# Patient Record
Sex: Female | Born: 1970 | Race: White | Hispanic: No | Marital: Single | Smoking: Former smoker
Health system: Southern US, Community
[De-identification: ages and names within clinical notes are randomized; demographics above are authoritative.]

## PROBLEM LIST (undated history)

## (undated) DIAGNOSIS — N200 Calculus of kidney: Secondary | ICD-10-CM

## (undated) DIAGNOSIS — N159 Renal tubulo-interstitial disease, unspecified: Secondary | ICD-10-CM

## (undated) DIAGNOSIS — C539 Malignant neoplasm of cervix uteri, unspecified: Secondary | ICD-10-CM

## (undated) HISTORY — PX: CYSTOSCOPY W/ URETERAL STENT PLACEMENT: SHX1429

## (undated) HISTORY — PX: OTHER SURGICAL HISTORY: SHX169

---

## 2002-06-21 ENCOUNTER — Encounter: Payer: Self-pay | Admitting: Obstetrics and Gynecology

## 2002-06-21 ENCOUNTER — Ambulatory Visit (HOSPITAL_COMMUNITY): Admission: RE | Admit: 2002-06-21 | Discharge: 2002-06-21 | Payer: Self-pay | Admitting: Obstetrics and Gynecology

## 2002-11-15 ENCOUNTER — Inpatient Hospital Stay (HOSPITAL_COMMUNITY): Admission: AD | Admit: 2002-11-15 | Discharge: 2002-11-17 | Payer: Self-pay | Admitting: Obstetrics and Gynecology

## 2002-12-19 ENCOUNTER — Encounter: Admission: RE | Admit: 2002-12-19 | Discharge: 2003-01-18 | Payer: Self-pay | Admitting: Obstetrics and Gynecology

## 2003-02-18 ENCOUNTER — Encounter: Admission: RE | Admit: 2003-02-18 | Discharge: 2003-03-20 | Payer: Self-pay | Admitting: Obstetrics and Gynecology

## 2004-04-18 ENCOUNTER — Other Ambulatory Visit: Admission: RE | Admit: 2004-04-18 | Discharge: 2004-04-18 | Payer: Self-pay | Admitting: Obstetrics and Gynecology

## 2005-03-18 ENCOUNTER — Other Ambulatory Visit: Admission: RE | Admit: 2005-03-18 | Discharge: 2005-03-18 | Payer: Self-pay | Admitting: Obstetrics and Gynecology

## 2010-02-23 ENCOUNTER — Emergency Department (HOSPITAL_COMMUNITY): Admission: EM | Admit: 2010-02-23 | Discharge: 2010-02-23 | Payer: Self-pay | Admitting: Emergency Medicine

## 2011-01-09 NOTE — Discharge Summary (Signed)
NAME:  KIFFANY, SCHELLING                    ACCOUNT NO.:  [id number]   MEDICAL RECORD NO.:  [id number]                   PATIENT TYPE:  INP   LOCATION:  9127                                 FACILITY:  [facility]   PHYSICIAN:  Malachi Pro. Ambrose Mantle, M.D.              DATE OF BIRTH:  Jul 28, 1971   DATE OF ADMISSION:  11/15/2002  DATE OF DISCHARGE:  11/17/2002                                 DISCHARGE SUMMARY   This is a 40 year old white, married female para 2, 0, 1, 2, gravida 4 with  an EDC of 11/18/02 by last period, compatible with a first trimester  ultrasound at 11 weeks who presented to labor and delivery with spontaneous  rupture of membranes at 4:30 a.m. on 11/15/02 and minimal contractions.  Prenatal care was complicated by history of low transverse cervical C-  section for breech presentation with her second pregnancy as she desired a  vaginal birth after cesarean.   PRENATAL LABS:  B positive, negative antibody. RPR nonreactive.  Rubella  equivocal.  Hepatitis B surface antigen negative.  GC and Chlamydia  negative.  Group B strep negative.  One hour Glucola 109.   PAST OBSTETRIC HISTORY:  In 1991 an early abortion, in 2000 spontaneous  delivery of an 8 pound, 6 ounce infant, in 2001 a C-section for breech, 8  pounds, 4 ounces.   PAST GYN HISTORY:  History of abnormal PAPs that resolved.   SURGICAL HISTORY:  C-section and elbow surgery.   MEDICAL HISTORY:  History of frequent urinary tract infections.   ALLERGIES:  Morphine, caused itching.   HOSPITAL COURSE:  On admission the patient was afebrile with normal vital  signs.  Contractions were irregular at every 10 minutes.  The cervix was 3  cm, 70%, grossly ruptured fluid was seen and it was a vertex  presentation.  The patient was placed on Pitocin, by 11:40 a.m.  the Pitocin was at 6  milliunits a minute.  Cervix 3-4 cm, 80%, vertex at a -2, the patient  requesting an epidural. The Pitocin was decreased by the nurse and at  1:30  p.m. the contractions were only every 5-6 minutes, cervix was unchanged so  the Pitocin was increased.  She began regular contraction pattern again,  progressed to full dilatation and delivered our way spontaneously over a  second degree midline laceration, by Dr. Ambrose Mantle, a living female infant, 8  pounds, 7 ounces with Apgar's of 9 at one and 9 at five minutes.  Placenta  was intact.  Uterus normal.  C-section scar was intact.  Rectal was  negative.  A 2 cm perineal cyst at the right of the midline ruptured on exam  and it was not removed because of close proximity to the rectum.  Midline  laceration was repaired with 3-0 Vicryl.  Blood loss about 400 cc.  Postpartum the patient did quite well and was discharged on the second  postpartum day.  LABORATORY DATA:  Hemoglobin was 12.3, hematocrit 36.3, white count 9700,  platelet count 170,000.  Follow up hemoglobin was normal.  RPR was  nonreactive.   FINAL DIAGNOSES:  1. Intrauterine pregnancy at 39 weeks delivered our way.  2. Vaginal birth after cesarean.   OPERATIONS:  Spontaneous delivery our way, repair of midline perineal  laceration.   FINAL CONDITION:  Instructions include our regular discharge instruction  booklet.  The patient is going to receive Rubella vaccine.  She also is  given a prescription for Percocet 5/325 #16 tablets, one q.4h p.r.n. for  pain.  She is asked to return to the office in six weeks for follow up  examination.                                                Malachi Pro. Ambrose Mantle, M.D.    TFH/MEDQ  D:  11/17/2002  T:  11/17/2002  Job:  [id number]

## 2011-10-06 ENCOUNTER — Other Ambulatory Visit: Payer: Self-pay | Admitting: Obstetrics and Gynecology

## 2011-10-06 DIAGNOSIS — Z1231 Encounter for screening mammogram for malignant neoplasm of breast: Secondary | ICD-10-CM

## 2011-10-28 ENCOUNTER — Ambulatory Visit
Admission: RE | Admit: 2011-10-28 | Discharge: 2011-10-28 | Disposition: A | Discharge status: Home / Self Care | Payer: BC Managed Care – PPO | Source: Ambulatory Visit | Attending: Obstetrics and Gynecology | Admitting: Obstetrics and Gynecology

## 2011-10-28 DIAGNOSIS — Z1231 Encounter for screening mammogram for malignant neoplasm of breast: Secondary | ICD-10-CM

## 2011-11-27 ENCOUNTER — Ambulatory Visit (HOSPITAL_COMMUNITY): Payer: BC Managed Care – PPO

## 2011-11-27 ENCOUNTER — Ambulatory Visit (HOSPITAL_COMMUNITY): Payer: BC Managed Care – PPO | Admitting: Anesthesiology

## 2011-11-27 ENCOUNTER — Ambulatory Visit (HOSPITAL_COMMUNITY)
Admission: AD | Admit: 2011-11-27 | Discharge: 2011-11-27 | Disposition: A | Discharge status: Home / Self Care | Payer: BC Managed Care – PPO | Source: Ambulatory Visit | Attending: Urology | Admitting: Urology

## 2011-11-27 ENCOUNTER — Encounter (HOSPITAL_COMMUNITY): Payer: Self-pay

## 2011-11-27 ENCOUNTER — Encounter (HOSPITAL_COMMUNITY): Payer: Self-pay | Admitting: Anesthesiology

## 2011-11-27 ENCOUNTER — Encounter (HOSPITAL_COMMUNITY)
Admission: EM | Disposition: A | Discharge status: Other Acute Inpatient Hospital | Payer: Self-pay | Source: Home / Self Care | Attending: Emergency Medicine

## 2011-11-27 ENCOUNTER — Ambulatory Visit: Admit: 2011-11-27 | Payer: Self-pay | Admitting: Urology

## 2011-11-27 ENCOUNTER — Encounter (HOSPITAL_COMMUNITY): Payer: Self-pay | Admitting: Emergency Medicine

## 2011-11-27 ENCOUNTER — Emergency Department (HOSPITAL_COMMUNITY): Payer: BC Managed Care – PPO

## 2011-11-27 ENCOUNTER — Other Ambulatory Visit: Payer: Self-pay | Admitting: Urology

## 2011-11-27 ENCOUNTER — Emergency Department (HOSPITAL_COMMUNITY)
Admission: EM | Admit: 2011-11-27 | Discharge: 2011-11-27 | Payer: BC Managed Care – PPO | Attending: Emergency Medicine | Admitting: Emergency Medicine

## 2011-11-27 ENCOUNTER — Encounter (HOSPITAL_COMMUNITY)
Admission: AD | Disposition: A | Discharge status: Home / Self Care | Payer: Self-pay | Source: Ambulatory Visit | Attending: Urology

## 2011-11-27 DIAGNOSIS — F17200 Nicotine dependence, unspecified, uncomplicated: Secondary | ICD-10-CM | POA: Insufficient documentation

## 2011-11-27 DIAGNOSIS — R3919 Other difficulties with micturition: Secondary | ICD-10-CM | POA: Insufficient documentation

## 2011-11-27 DIAGNOSIS — M545 Low back pain, unspecified: Secondary | ICD-10-CM | POA: Insufficient documentation

## 2011-11-27 DIAGNOSIS — N1330 Unspecified hydronephrosis: Secondary | ICD-10-CM | POA: Insufficient documentation

## 2011-11-27 DIAGNOSIS — Z79899 Other long term (current) drug therapy: Secondary | ICD-10-CM | POA: Insufficient documentation

## 2011-11-27 DIAGNOSIS — N201 Calculus of ureter: Secondary | ICD-10-CM | POA: Insufficient documentation

## 2011-11-27 DIAGNOSIS — N390 Urinary tract infection, site not specified: Secondary | ICD-10-CM | POA: Insufficient documentation

## 2011-11-27 DIAGNOSIS — R509 Fever, unspecified: Secondary | ICD-10-CM | POA: Insufficient documentation

## 2011-11-27 HISTORY — DX: Renal tubulo-interstitial disease, unspecified: N15.9

## 2011-11-27 HISTORY — DX: Malignant neoplasm of cervix uteri, unspecified: C53.9

## 2011-11-27 HISTORY — PX: CYSTOSCOPY W/ URETERAL STENT PLACEMENT: SHX1429

## 2011-11-27 LAB — URINALYSIS, ROUTINE W REFLEX MICROSCOPIC
Ketones, ur: 15 mg/dL — AB
Nitrite: NEGATIVE
Protein, ur: 100 mg/dL — AB
pH: 6 (ref 5.0–8.0)

## 2011-11-27 LAB — CBC
MCV: 87.3 fL (ref 78.0–100.0)
Platelets: 110 10*3/uL — ABNORMAL LOW (ref 150–400)
RBC: 4.02 MIL/uL (ref 3.87–5.11)
WBC: 13.6 10*3/uL — ABNORMAL HIGH (ref 4.0–10.5)

## 2011-11-27 LAB — SURGICAL PCR SCREEN
MRSA, PCR: NEGATIVE
Staphylococcus aureus: NEGATIVE

## 2011-11-27 LAB — POCT I-STAT, CHEM 8
BUN: 15 mg/dL (ref 6–23)
Chloride: 105 mEq/L (ref 96–112)
Sodium: 139 mEq/L (ref 135–145)

## 2011-11-27 LAB — DIFFERENTIAL
Eosinophils Relative: 1 % (ref 0–5)
Monocytes Relative: 4 % (ref 3–12)
Neutrophils Relative %: 89 % — ABNORMAL HIGH (ref 43–77)

## 2011-11-27 LAB — URINE MICROSCOPIC-ADD ON

## 2011-11-27 SURGERY — CYSTOSCOPY, WITH RETROGRADE PYELOGRAM AND URETERAL STENT INSERTION
Anesthesia: General | Laterality: Right

## 2011-11-27 SURGERY — CYSTOSCOPY, WITH RETROGRADE PYELOGRAM AND URETERAL STENT INSERTION
Anesthesia: General | Laterality: Right | Wound class: Clean Contaminated

## 2011-11-27 MED ORDER — ONDANSETRON HCL 4 MG/2ML IJ SOLN
4.0000 mg | Freq: Once | INTRAMUSCULAR | Status: AC
  Administered 2011-11-27: 4 mg via INTRAVENOUS
  Filled 2011-11-27: qty 2

## 2011-11-27 MED ORDER — CITRIC ACID-SODIUM CITRATE 334-500 MG/5ML PO SOLN
ORAL | Status: AC
  Filled 2011-11-27: qty 15

## 2011-11-27 MED ORDER — HYDROMORPHONE HCL PF 2 MG/ML IJ SOLN: 4.0000 mg | Freq: Once | INTRAMUSCULAR | Status: DC

## 2011-11-27 MED ORDER — HYDROMORPHONE HCL PF 1 MG/ML IJ SOLN
1.0000 mg | Freq: Once | INTRAMUSCULAR | Status: AC
  Administered 2011-11-27: 1 mg via INTRAVENOUS
  Filled 2011-11-27: qty 1

## 2011-11-27 MED ORDER — IOHEXOL 300 MG/ML  SOLN
INTRAMUSCULAR | Status: DC | PRN
  Administered 2011-11-27: 6 mL via INTRAVENOUS

## 2011-11-27 MED ORDER — SUCCINYLCHOLINE CHLORIDE 20 MG/ML IJ SOLN
INTRAMUSCULAR | Status: DC | PRN
  Administered 2011-11-27: 100 mg via INTRAVENOUS

## 2011-11-27 MED ORDER — IOHEXOL 300 MG/ML  SOLN
INTRAMUSCULAR | Status: AC
  Filled 2011-11-27: qty 1

## 2011-11-27 MED ORDER — STERILE WATER FOR IRRIGATION IR SOLN
Status: DC | PRN
  Administered 2011-11-27: 3000 mL

## 2011-11-27 MED ORDER — DEXTROSE 5 % IV SOLN
1.0000 g | Freq: Once | INTRAVENOUS | Status: AC
  Administered 2011-11-27: 1 g via INTRAVENOUS
  Filled 2011-11-27 (×2): qty 10

## 2011-11-27 MED ORDER — LACTATED RINGERS IV SOLN
INTRAVENOUS | Status: DC
  Administered 2011-11-27 (×3): via INTRAVENOUS

## 2011-11-27 MED ORDER — DEXAMETHASONE SODIUM PHOSPHATE 4 MG/ML IJ SOLN
INTRAMUSCULAR | Status: DC | PRN
  Administered 2011-11-27: 10 mg via INTRAVENOUS

## 2011-11-27 MED ORDER — SODIUM CHLORIDE 0.9 % IV BOLUS (SEPSIS)
1000.0000 mL | Freq: Once | INTRAVENOUS | Status: AC
  Administered 2011-11-27: 1000 mL via INTRAVENOUS

## 2011-11-27 MED ORDER — 0.9 % SODIUM CHLORIDE (POUR BTL) OPTIME
TOPICAL | Status: DC | PRN
  Administered 2011-11-27: 1000 mL

## 2011-11-27 MED ORDER — FENTANYL CITRATE 0.05 MG/ML IJ SOLN
INTRAMUSCULAR | Status: DC | PRN
  Administered 2011-11-27 (×2): 50 ug via INTRAVENOUS

## 2011-11-27 MED ORDER — HYDROMORPHONE HCL PF 1 MG/ML IJ SOLN: 4.0000 mg | Freq: Once | INTRAMUSCULAR | Status: DC

## 2011-11-27 MED ORDER — LACTATED RINGERS IV SOLN: INTRAVENOUS | Status: DC

## 2011-11-27 MED ORDER — MIDAZOLAM HCL 5 MG/5ML IJ SOLN
INTRAMUSCULAR | Status: DC | PRN
  Administered 2011-11-27 (×2): 1 mg via INTRAVENOUS

## 2011-11-27 MED ORDER — HYDROMORPHONE HCL PF 4 MG/ML IJ SOLN
INTRAMUSCULAR | Status: AC
  Administered 2011-11-27: 2 mg
  Filled 2011-11-27: qty 1

## 2011-11-27 MED ORDER — PROMETHAZINE HCL 25 MG/ML IJ SOLN: 6.2500 mg | INTRAMUSCULAR | Status: DC | PRN

## 2011-11-27 MED ORDER — KETOROLAC TROMETHAMINE 30 MG/ML IJ SOLN
INTRAMUSCULAR | Status: DC | PRN
  Administered 2011-11-27: 30 mg via INTRAVENOUS

## 2011-11-27 MED ORDER — CITRIC ACID-SODIUM CITRATE 334-500 MG/5ML PO SOLN
15.0000 mL | Freq: Once | ORAL | Status: AC
  Administered 2011-11-27: 15 mL via ORAL
  Filled 2011-11-27: qty 15

## 2011-11-27 MED ORDER — MEPERIDINE HCL 50 MG/ML IJ SOLN: 6.2500 mg | INTRAMUSCULAR | Status: DC | PRN

## 2011-11-27 MED ORDER — MUPIROCIN 2 % EX OINT
TOPICAL_OINTMENT | CUTANEOUS | Status: AC
  Filled 2011-11-27: qty 22

## 2011-11-27 MED ORDER — PROPOFOL 10 MG/ML IV EMUL
INTRAVENOUS | Status: DC | PRN
  Administered 2011-11-27: 50 mg via INTRAVENOUS
  Administered 2011-11-27: 150 mg via INTRAVENOUS

## 2011-11-27 MED ORDER — LIDOCAINE HCL (CARDIAC) 20 MG/ML IV SOLN
INTRAVENOUS | Status: DC | PRN
  Administered 2011-11-27: 80 mg via INTRAVENOUS

## 2011-11-27 MED ORDER — FENTANYL CITRATE 0.05 MG/ML IJ SOLN: 25.0000 ug | INTRAMUSCULAR | Status: DC | PRN

## 2011-11-27 MED ORDER — ONDANSETRON HCL 4 MG/2ML IJ SOLN
INTRAMUSCULAR | Status: DC | PRN
  Administered 2011-11-27: 4 mg via INTRAVENOUS

## 2011-11-27 SURGICAL SUPPLY — 19 items
ADAPTER CATH URET PLST 4-6FR (CATHETERS) ×2 IMPLANT
ADPR CATH URET STRL DISP 4-6FR (CATHETERS) ×1
BAG URO CATCHER STRL LF (DRAPE) ×2 IMPLANT
CANISTER OMNI JUG 16 LITER (MISCELLANEOUS) ×1 IMPLANT
CATH INTERMIT  6FR 70CM (CATHETERS) IMPLANT
CATH URET 5FR 28IN CONE TIP (BALLOONS) ×1
CATH URET 5FR 70CM CONE TIP (BALLOONS) IMPLANT
CLOTH BEACON ORANGE TIMEOUT ST (SAFETY) ×2 IMPLANT
DRAPE CAMERA CLOSED 9X96 (DRAPES) ×2 IMPLANT
GLOVE BIOGEL M 7.0 STRL (GLOVE) ×2 IMPLANT
GOWN STRL NON-REIN LRG LVL3 (GOWN DISPOSABLE) ×4 IMPLANT
GUIDEWIRE STR DUAL SENSOR (WIRE) ×1 IMPLANT
MANIFOLD NEPTUNE II (INSTRUMENTS) ×1 IMPLANT
MARKER SKIN DUAL TIP RULER LAB (MISCELLANEOUS) ×2 IMPLANT
NS IRRIG 1000ML POUR BTL (IV SOLUTION) ×1 IMPLANT
PACK CYSTO (CUSTOM PROCEDURE TRAY) ×2 IMPLANT
STENT CONTOUR 6FRX24X.038 (STENTS) ×1 IMPLANT
TUBING CONNECTING 10 (TUBING) ×2 IMPLANT
WIRE COONS/BENSON .038X145CM (WIRE) ×2 IMPLANT

## 2011-11-27 NOTE — H&P (Signed)
History and Physical  Chief Complaint: Severe right flank pain associated with nausea and vomiting.  History of Present Illness: Patient is a 41 years old female who started having right flank pain 3 days ago. The pain was severe and associated with nausea. She went to the urgent care for what she thought was a urinary tract infection. She has a past history of recurrent urinary tract infections for several years. She was started on Cipro. However yesterday she started having a fever and the pain was severe and associated with nausea and vomiting. She then went to the emergency room early this morning. Workup included a CT scan that showed non-obstructing calculi in the lower pole of the right kidney, moderate right hydronephrosis down to a 6 x 10 mm proximal ureteral calculus. The left kidney is normal. The right kidney is smaller than the left. She is still complaining of severe pain. She needs a cystoscopy and double-J stent placement.  Past Medical History  Diagnosis Date  . Cervical ca     "disappeared with biopsy treatment"  . Kidney infection     history   Past Surgical History  Procedure Date  . Cesarean section   . Arm surgery     Medications: Cipro, Hydrocodone/APAP 10/500 mgm, Levonorgestrel, Zofran. Allergies:  Allergies  Allergen Reactions  . Morphine And Related Hives    Family History  Problem Relation Age of Onset  . Hypertension Mother   . Fibromyalgia Father   . Urolithiasis Father   . Rheum arthritis Father   . Hypertension Father    There is a family history of breast cancer on her father's side.  Social History:  reports that she quit smoking about 3 years ago. Her smoking use included Cigarettes. She quit after 15 years of use. She does not have any smokeless tobacco history on file. She reports that she drinks alcohol. She reports that she uses illicit drugs (Marijuana).  She is divorced and has 3 children.  ROS: All systems are reviewed and negative except  as noted. She has frequency and has been voiding small amount of urine at the time. She denies gross hematuria.  Physical Exam:  Vital signs in last 24 hours: Temp:  [98 F (36.7 C)-99 F (37.2 C)] 98.1 F (36.7 C) (04/05 1111) Pulse Rate:  [76-94] 84  (04/05 1111) Resp:  [16-18] 16  (04/05 1111) BP: (113-136)/(64-87) 136/83 mmHg (04/05 1111) SpO2:  [96 %-100 %] 100 % (04/05 1111)  Head: Normal. Pupils are equal and reactive to light. Ears, nose and throat are within normal limits. Neck: Supple. No cervical adenopathy, no thyromegaly. Cardiovascular: Skin warm; not flushed Respiratory: Breaths quiet; no shortness of breath Abdomen: No masses Neurological: Normal sensation to touch Musculoskeletal: Normal motor function arms and legs Lymphatics: No inguinal adenopathy Skin: No rashes Genitourinary: Meatus is normal. The cervix is firm, in the midline and nontender.                        Uterus is normal. There is no adnexal mass. Laboratory Data:  Results for orders placed during the hospital encounter of 11/27/11 (from the past 24 hour(s))  URINALYSIS, ROUTINE W REFLEX MICROSCOPIC     Status: Abnormal   Collection Time   11/27/11  4:20 AM      Component Value Range   Color, Urine AMBER (*) YELLOW    APPearance CLOUDY (*) CLEAR    Specific Gravity, Urine 1.038 (*) 1.005 - 1.030  pH 6.0  5.0 - 8.0    Glucose, UA NEGATIVE  NEGATIVE (mg/dL)   Hgb urine dipstick LARGE (*) NEGATIVE    Bilirubin Urine SMALL (*) NEGATIVE    Ketones, ur 15 (*) NEGATIVE (mg/dL)   Protein, ur 161 (*) NEGATIVE (mg/dL)   Urobilinogen, UA 1.0  0.0 - 1.0 (mg/dL)   Nitrite NEGATIVE  NEGATIVE    Leukocytes, UA TRACE (*) NEGATIVE   URINE MICROSCOPIC-ADD ON     Status: Abnormal   Collection Time   11/27/11  4:20 AM      Component Value Range   Squamous Epithelial / LPF MANY (*) RARE    WBC, UA 3-6  <3 (WBC/hpf)   RBC / HPF 0-2  <3 (RBC/hpf)   Bacteria, UA RARE  RARE   POCT PREGNANCY, URINE     Status:  Normal   Collection Time   11/27/11  4:30 AM      Component Value Range   Preg Test, Ur NEGATIVE  NEGATIVE   CBC     Status: Abnormal   Collection Time   11/27/11  7:48 AM      Component Value Range   WBC 13.6 (*) 4.0 - 10.5 (K/uL)   RBC 4.02  3.87 - 5.11 (MIL/uL)   Hemoglobin 12.3  12.0 - 15.0 (g/dL)   HCT 09.6 (*) 04.5 - 46.0 (%)   MCV 87.3  78.0 - 100.0 (fL)   MCH 30.6  26.0 - 34.0 (pg)   MCHC 35.0  30.0 - 36.0 (g/dL)   RDW 40.9  81.1 - 91.4 (%)   Platelets 110 (*) 150 - 400 (K/uL)  DIFFERENTIAL     Status: Abnormal   Collection Time   11/27/11  7:48 AM      Component Value Range   Neutrophils Relative 89 (*) 43 - 77 (%)   Lymphocytes Relative 6 (*) 12 - 46 (%)   Monocytes Relative 4  3 - 12 (%)   Eosinophils Relative 1  0 - 5 (%)   Basophils Relative 0  0 - 1 (%)   Neutro Abs 12.2 (*) 1.7 - 7.7 (K/uL)   Lymphs Abs 0.8  0.7 - 4.0 (K/uL)   Monocytes Absolute 0.5  0.1 - 1.0 (K/uL)   Eosinophils Absolute 0.1  0.0 - 0.7 (K/uL)   Basophils Absolute 0.0  0.0 - 0.1 (K/uL)   Smear Review MORPHOLOGY UNREMARKABLE    POCT I-STAT, CHEM 8     Status: Abnormal   Collection Time   11/27/11  7:58 AM      Component Value Range   Sodium 139  135 - 145 (mEq/L)   Potassium 4.2  3.5 - 5.1 (mEq/L)   Chloride 105  96 - 112 (mEq/L)   BUN 15  6 - 23 (mg/dL)   Creatinine, Ser 7.82  0.50 - 1.10 (mg/dL)   Glucose, Bld 956 (*) 70 - 99 (mg/dL)   Calcium, Ion 2.13  0.86 - 1.32 (mmol/L)   TCO2 25  0 - 100 (mmol/L)   Hemoglobin 12.2  12.0 - 15.0 (g/dL)   HCT 57.8  46.9 - 62.9 (%)    Basename 11/27/11 0758  CREATININE 1.10    Xrays: I independently reviewed the CT scan and the findings are as noted above in the history of present illness.  Impression/Assessment:  Right ureteral calculus, right hydronephrosis, right renal calculus. Smaller right kidney.  Plan:  Cystoscopy, right retrograde pyelogram and insertion of double-J stent. The ureteral calculus will be  treated at a later date with  either ESL or ureteroscopy when the urine is sterile.  Mariavictoria Nottingham-HENRY 11/27/2011, 12:40 PM

## 2011-11-27 NOTE — Anesthesia Preprocedure Evaluation (Signed)
Anesthesia Evaluation  Patient identified by MRN, date of birth, ID band Patient awake    Reviewed: Allergy & Precautions, H&P , NPO status , Patient's Chart, lab work & pertinent test results  Airway Mallampati: II TM Distance: >3 FB Neck ROM: Full    Dental No notable dental hx.    Pulmonary neg pulmonary ROS,  breath sounds clear to auscultation  Pulmonary exam normal       Cardiovascular negative cardio ROS  Rhythm:Regular Rate:Normal     Neuro/Psych negative neurological ROS  negative psych ROS   GI/Hepatic negative GI ROS, Neg liver ROS, GERD-  Poorly Controlled,  Endo/Other  negative endocrine ROS  Renal/GU negative Renal ROS  negative genitourinary   Musculoskeletal negative musculoskeletal ROS (+)   Abdominal   Peds negative pediatric ROS (+)  Hematology negative hematology ROS (+)   Anesthesia Other Findings   Reproductive/Obstetrics negative OB ROS                           Anesthesia Physical Anesthesia Plan  ASA: II  Anesthesia Plan: General   Post-op Pain Management:    Induction: Intravenous  Airway Management Planned: Oral ETT  Additional Equipment:   Intra-op Plan:   Post-operative Plan: Extubation in OR  Informed Consent: I have reviewed the patients History and Physical, chart, labs and discussed the procedure including the risks, benefits and alternatives for the proposed anesthesia with the patient or authorized representative who has indicated his/her understanding and acceptance.   Dental advisory given  Plan Discussed with: CRNA  Anesthesia Plan Comments:         Anesthesia Quick Evaluation

## 2011-11-27 NOTE — ED Notes (Signed)
Pt claimed that the pain to her back is the same at 7/10. Will continue to monitor.

## 2011-11-27 NOTE — Transfer of Care (Signed)
Immediate Anesthesia Transfer of Care Note  Patient: Candice Clark  Procedure(s) Performed: Procedure(s) (LRB): CYSTOSCOPY WITH RETROGRADE PYELOGRAM/URETERAL STENT PLACEMENT (Right)  Patient Location: PACU  Anesthesia Type: General  Level of Consciousness: awake, alert  and oriented  Airway & Oxygen Therapy: Patient Spontanous Breathing and Patient connected to face mask oxygen  Post-op Assessment: Report given to PACU RN and Post -op Vital signs reviewed and stable  Post vital signs: Reviewed and stable  Complications: No apparent anesthesia complications

## 2011-11-27 NOTE — ED Notes (Signed)
Patient presents stating that Tuesday she went to the urgent care for pain in her kidney area.  This is an ongoing problem with her.   Has not yet gotten her culture results from them, has been on Cipro and tonight she started with fever off and on and small amount of vomiting

## 2011-11-27 NOTE — Op Note (Signed)
Candice Clark is a 41 y.o.   11/27/2011  Pre Op Diagnosis: Right ureteral calculus, right hydronephrosis, right renal calculus.   Post Op Diagnosis: Same  Procedure done: Cystoscopy, right retrograde pyelogram and insertion of double-J stent.  Surgeon: Wendie Simmer. Pierce Barocio  Anesthesia: Gen.  Indication: Patient is a 41 years old female who presented to the ER early this morning with a history of severe right-sided abdominal pain that started 3 days ago. She has a past history of recurrent urinary tract infections. She thought that her pain was secondary to UTI. She was seen in an urgent care and was started on Cipro. However she continued to have right-sided pain; then last evening she had a temperature of 102. Workup in the emergency room included a CT scan that showed non-obstructing right renal calculi, moderate right hydronephrosis to a 6 x 10 mm proximal ureteral calculus. She continues to have pain. She is scheduled for cystoscopy and double-J stent placement.  Procedure: The patient was identified by her wrist band and proper timeout was taken.  Under general anesthesia she was prepped and draped and placed in the dorsolithotomy position. A panendoscope was inserted in the bladder. Urine was obtained for culture and sensitivity. The bladder mucosa is normal. There is no stone or tumor in the bladder. The ureteral orifices are in normal position and shape.  Retrograde pyelogram:  A cone-tip catheter was passed through the cystoscope and the right ureteral orifice. Contrast was injected through the cone-tip catheter. The distal ureter appears normal. There was no contrast above the mid ureter. The cone-tip catheter was then removed.  A sensor wire was passed through the cystoscope and the right ureter up to the renal pelvis. A #[id number] French-[id number] double-J stent was passed over the sensor wire. The proximal curl of the double-J stent is in the renal pelvis. The distal curl is in the bladder. The  bladder was then emptied and the cystoscope and guidewire were removed.  Patient tolerated the procedure well and left the OR in satisfactory condition to postanesthesia care unit.

## 2011-11-27 NOTE — ED Provider Notes (Signed)
History     CSN: [id number]  Arrival date & time 11/27/11  0405   First MD Initiated Contact with Patient 11/27/11 [phone]      Chief Complaint  Patient presents with  . Emesis    (Consider location/radiation/quality/duration/timing/severity/associated sxs/prior treatment) HPI Comments: Patient with a history of frequent UTI's and a history of malformation of right kidney presents after having been treated at an Urgent Care near her home for UTI and placed on Cipro - she states that she saw them on Monday, felt like she was getting a UTI because of the pain and nausea and vomiting - states that she has been waiting on the culture results from that because she thinks she may be on the wrong antibiotic - she reports now with abdominal pain as well as the right back and flank pain - reports fever as high as 102 last night.  Denies constipation or diarrhea, vaginal discharge - is currently on menstrual cycle.  Patient is a 41 y.o. female presenting with vomiting. The history is provided by the patient. No language interpreter was used.  Emesis  This is a new problem. The current episode started more than 2 days ago. The problem occurs 2 to 4 times per day. The problem has not changed since onset.The emesis has an appearance of stomach contents. The maximum temperature recorded prior to her arrival was 102 to 102.9 F. The fever has been present for 3 to 4 days. Associated symptoms include abdominal pain, chills and a fever. Pertinent negatives include no arthralgias, no cough, no diarrhea, no headaches, no myalgias, no sweats and no URI.    History reviewed. No pertinent past medical history.  Past Surgical History  Procedure Date  . Cesarean section   . Arm surgery     No family history on file.  History  Substance Use Topics  . Smoking status: Current Everyday Smoker  . Smokeless tobacco: Not on file  . Alcohol Use: Yes    OB History    Grav Para Term Preterm Abortions TAB SAB Ect  Mult Living                  Review of Systems  Constitutional: Positive for fever and chills.  HENT: Negative for neck pain.   Eyes: Negative for pain.  Respiratory: Negative for cough.   Gastrointestinal: Positive for nausea, vomiting and abdominal pain. Negative for diarrhea, constipation and abdominal distention.  Genitourinary: Positive for dysuria, frequency, flank pain and difficulty urinating. Negative for vaginal bleeding, vaginal discharge and vaginal pain.  Musculoskeletal: Negative for myalgias and arthralgias.  Neurological: Negative for headaches.  All other systems reviewed and are negative.    Allergies  Morphine and related  Home Medications   Current Outpatient Rx  Name Route Sig Dispense Refill  . CIPROFLOXACIN HCL 500 MG PO TABS Oral Take 500 mg by mouth 2 (two) times daily. For kidney infection started 11/24/11 for 5 days    . HYDROCODONE-ACETAMINOPHEN 10-500 MG PO TABS Oral Take 1 tablet by mouth every 6 (six) hours as needed. For pain    . LEVONORGESTREL 20 MCG/24HR IU IUD Intrauterine 1 each by Intrauterine route once.    Marland Kitchen ONDANSETRON HCL 4 MG PO TABS Oral Take 4 mg by mouth every 6 (six) hours as needed. For nausea      BP 113/68  Pulse 79  Temp(Src) 98.2 F (36.8 C) (Oral)  Resp 18  SpO2 100%  LMP 11/21/2011  Physical Exam  Nursing  note and vitals reviewed. Constitutional: She is oriented to person, place, and time. She appears well-developed and well-nourished. No distress.  HENT:  Head: Normocephalic and atraumatic.  Right Ear: External ear normal.  Left Ear: External ear normal.  Nose: Nose normal.  Mouth/Throat: Oropharynx is clear and moist. No oropharyngeal exudate.  Eyes: Conjunctivae are normal. Pupils are equal, round, and reactive to light. No scleral icterus.  Neck: Normal range of motion. Neck supple.  Cardiovascular: Normal rate, regular rhythm and normal heart sounds.  Exam reveals no gallop and no friction rub.   No murmur  heard. Pulmonary/Chest: Effort normal and breath sounds normal. No respiratory distress. She has no wheezes. She has no rales. She exhibits no tenderness.  Abdominal: Soft. Bowel sounds are normal. She exhibits no distension and no mass. There is tenderness in the right upper quadrant and periumbilical area. There is CVA tenderness. There is no rebound and no guarding.         Right CVA tenderness  Musculoskeletal: Normal range of motion. She exhibits no edema and no tenderness.  Lymphadenopathy:    She has no cervical adenopathy.  Neurological: She is alert and oriented to person, place, and time. No cranial nerve deficit.  Skin: Skin is warm and dry. No rash noted. No erythema. No pallor.  Psychiatric: She has a normal mood and affect. Her behavior is normal. Judgment and thought content normal.    ED Course  Procedures (including critical care time)  Labs Reviewed  URINALYSIS, ROUTINE W REFLEX MICROSCOPIC - Abnormal; Notable for the following:    Color, Urine AMBER (*) BIOCHEMICALS MAY BE AFFECTED BY COLOR   APPearance CLOUDY (*)    Specific Gravity, Urine 1.038 (*)    Hgb urine dipstick LARGE (*)    Bilirubin Urine SMALL (*)    Ketones, ur 15 (*)    Protein, ur 100 (*)    Leukocytes, UA TRACE (*)    All other components within normal limits  URINE MICROSCOPIC-ADD ON - Abnormal; Notable for the following:    Squamous Epithelial / LPF MANY (*)    All other components within normal limits  POCT PREGNANCY, URINE  CBC  DIFFERENTIAL   No results found. Results for orders placed during the hospital encounter of 11/27/11  URINALYSIS, ROUTINE W REFLEX MICROSCOPIC      Component Value Range   Color, Urine AMBER (*) YELLOW    APPearance CLOUDY (*) CLEAR    Specific Gravity, Urine 1.038 (*) 1.005 - 1.030    pH 6.0  5.0 - 8.0    Glucose, UA NEGATIVE  NEGATIVE (mg/dL)   Hgb urine dipstick LARGE (*) NEGATIVE    Bilirubin Urine SMALL (*) NEGATIVE    Ketones, ur 15 (*) NEGATIVE  (mg/dL)   Protein, ur 956 (*) NEGATIVE (mg/dL)   Urobilinogen, UA 1.0  0.0 - 1.0 (mg/dL)   Nitrite NEGATIVE  NEGATIVE    Leukocytes, UA TRACE (*) NEGATIVE   POCT PREGNANCY, URINE      Component Value Range   Preg Test, Ur NEGATIVE  NEGATIVE   URINE MICROSCOPIC-ADD ON      Component Value Range   Squamous Epithelial / LPF MANY (*) RARE    WBC, UA 3-6  <3 (WBC/hpf)   RBC / HPF 0-2  <3 (RBC/hpf)   Bacteria, UA RARE  RARE   CBC      Component Value Range   WBC 13.6 (*) 4.0 - 10.5 (K/uL)   RBC 4.02  3.87 -  5.11 (MIL/uL)   Hemoglobin 12.3  12.0 - 15.0 (g/dL)   HCT 16.1 (*) 09.6 - 46.0 (%)   MCV 87.3  78.0 - 100.0 (fL)   MCH 30.6  26.0 - 34.0 (pg)   MCHC 35.0  30.0 - 36.0 (g/dL)   RDW 04.5  40.9 - 81.1 (%)   Platelets 110 (*) 150 - 400 (K/uL)  DIFFERENTIAL      Component Value Range   Neutrophils Relative 89 (*) 43 - 77 (%)   Lymphocytes Relative 6 (*) 12 - 46 (%)   Monocytes Relative 4  3 - 12 (%)   Eosinophils Relative 1  0 - 5 (%)   Basophils Relative 0  0 - 1 (%)   Neutro Abs 12.2 (*) 1.7 - 7.7 (K/uL)   Lymphs Abs 0.8  0.7 - 4.0 (K/uL)   Monocytes Absolute 0.5  0.1 - 1.0 (K/uL)   Eosinophils Absolute 0.1  0.0 - 0.7 (K/uL)   Basophils Absolute 0.0  0.0 - 0.1 (K/uL)   Smear Review MORPHOLOGY UNREMARKABLE    POCT I-STAT, CHEM 8      Component Value Range   Sodium 139  135 - 145 (mEq/L)   Potassium 4.2  3.5 - 5.1 (mEq/L)   Chloride 105  96 - 112 (mEq/L)   BUN 15  6 - 23 (mg/dL)   Creatinine, Ser 9.14  0.50 - 1.10 (mg/dL)   Glucose, Bld 782 (*) 70 - 99 (mg/dL)   Calcium, Ion 9.56  2.13 - 1.32 (mmol/L)   TCO2 25  0 - 100 (mmol/L)   Hemoglobin 12.2  12.0 - 15.0 (g/dL)   HCT 08.6  57.8 - 46.9 (%)   Ct Abdomen Pelvis Wo Contrast  11/27/2011  *RADIOLOGY REPORT*  Clinical Data: Right-sided flank pain, no history of renal stones  CT ABDOMEN AND PELVIS WITHOUT CONTRAST  Technique:  Multidetector CT imaging of the abdomen and pelvis was performed following the standard protocol  without intravenous contrast.  Comparison: None.  Findings:  The lack of intravenous contrast limits the ability to evaluate solid abdominal organs.  Normal hepatic contour. The gallbladder is somewhat distended but otherwise normal on this noncontrast examination.  No definite evidence of gallbladder wall thickening or pericholecystic fluid. No ascites.  The right kidney is noted to be atrophied in comparison to the left with the right kidney measuring approximately 8.3 cm in length (coronal image 43, series 6/2) and the left kidney measuring approximately 12.7 cm in length (image 14, series 602).  There is moderate right-sided ureterectasis and hydronephrosis secondary to an obstructing approximately 5 x 6 x 10 mm stone within the mid aspect of the right ureter.  There are two punctate (approximately 2-3 mm) nonobstructing stones within the lower pole of the right kidney (image 37, series 2).  No left-sided renal stones. Several hypoattenuating right-sided renal lesions are suspected but these may represent dilated calyces but are incompletely evaluated without intravenous contrast.  No perinephric stranding.  No stones are seen within the urinary bladder.  No left-sided hydronephrosis. Normal noncontrast appearance of the bilateral adrenal glands, pancreas and spleen.  Evaluation of the bowel is limited secondary to lack of enteric contrast and mesenteric fat.  Suspect colonic diverticulosis.  No definite evidence of diverticulitis.  The appendix is not definitively identified, however there is no stranding within the right lower abdominal quadrant.  Normal caliber abdominal aorta. No definite retroperitoneal, mesenteric, pelvic or inguinal lymphadenopathy, though note, again evaluation limited secondary to lack of  intravenous contrast.  An IUD is seen within the uterus. No discrete adnexal mass.  No free fluid within the pelvis.  Limited visualization of the lower thorax demonstrates minimal dependent  atelectasis.  No focal airspace opacities.  No pleural effusion.  Normal heart size.  No pericardial effusion.  Incidental note is made of a mild pectus exclude deformity.  No acute or aggressive osseous abnormalities.  IMPRESSION:  1.  Obstructing approximately  5 x 6 x 10 mm stone within the mid aspect of the right ureter with associated moderate upstream ureterectasis and hydronephrosis. Given the associated atrophy of the right kidney, the chronicity of this finding is indeterminate. No perinephric stranding.  2.  Additional punctate nonobstructing right-sided renal stones.  3. Suspect several right-sided hypoattenuating renal lesions, versus dilated calyces, incompletely evaluated without intravenous contrast.  Original Report Authenticated By: Waynard Reeds, M.D.   Mm Digital Screening  10/30/2011  DG SCREEN MAMMOGRAM BILATERAL Bilateral CC and MLO view(s) were taken.  DIGITAL SCREENING MAMMOGRAM WITH CAD The breast tissue is extremely dense.  No masses or malignant type calcifications are identified.  Images were processed with CAD.  IMPRESSION: No specific mammographic evidence of malignancy.  Next screening mammogram is recommended in one  year.  A result letter of this screening mammogram will be mailed directly to the patient.  ASSESSMENT: Negative - BI-RADS 1  Screening mammogram in 1 year. ,      Right ureteralithiasis    MDM  Patient with history of malformed right kidney and recent history of UTI being treated with cipro for this presents with worsening right flank pain with fever and vomiting now with obstructing stone on CT scan, plan to discuss the results with urology - the patient reports having never seen urology in the past.     Patient to be transferred to [facility] Stay for Dr. Brunilda Payor to place stent.  Izola Price [place], [place] 11/27/11 [phone]

## 2011-11-27 NOTE — ED Notes (Signed)
PT. REPORTS NAUSEA,VOMITTING AND RIGHT FLANK PAIN SINCE Tuesday , CURRENTLY TAKING CIPRO ANTIBIOTIC FOR UTI.

## 2011-11-27 NOTE — Anesthesia Postprocedure Evaluation (Signed)
  Anesthesia Post-op Note  Patient: Candice Clark  Procedure(s) Performed: Procedure(s) (LRB): CYSTOSCOPY WITH RETROGRADE PYELOGRAM/URETERAL STENT PLACEMENT (Right)  Patient Location: PACU  Anesthesia Type: General  Level of Consciousness: awake and alert   Airway and Oxygen Therapy: Patient Spontanous Breathing  Post-op Pain: mild  Post-op Assessment: Post-op Vital signs reviewed, Patient's Cardiovascular Status Stable, Respiratory Function Stable, Patent Airway and No signs of Nausea or vomiting  Post-op Vital Signs: stable  Complications: No apparent anesthesia complications

## 2011-11-28 LAB — URINE CULTURE: Culture: NO GROWTH

## 2011-12-01 NOTE — ED Provider Notes (Signed)
Medical screening examination/treatment/procedure(s) were performed by non-physician practitioner and as supervising physician I was immediately available for consultation/collaboration.  Olivia Mackie, MD 12/01/11 [phone]

## 2011-12-07 ENCOUNTER — Other Ambulatory Visit: Payer: Self-pay | Admitting: Urology

## 2011-12-08 ENCOUNTER — Encounter (HOSPITAL_COMMUNITY): Payer: Self-pay | Admitting: *Deleted

## 2011-12-08 NOTE — Pre-Procedure Instructions (Signed)
Patient told to bring blue folder , driver, picture ID, insurance info, to arrive in [place] at 1415. To follow laxative instructions on Wednesday afternoon. NPO for solid food after midnight, then may have clear liquids until 0815am, then only meds with a sip of water if needed. Patient verbalized understanding of instructions.

## 2011-12-10 ENCOUNTER — Ambulatory Visit (HOSPITAL_COMMUNITY)
Admission: RE | Admit: 2011-12-10 | Discharge: 2011-12-10 | Disposition: A | Discharge status: Home / Self Care | Payer: BC Managed Care – PPO | Source: Ambulatory Visit | Attending: Urology | Admitting: Urology

## 2011-12-10 ENCOUNTER — Encounter (HOSPITAL_COMMUNITY)
Admission: RE | Disposition: A | Discharge status: Home / Self Care | Payer: Self-pay | Source: Ambulatory Visit | Attending: Urology

## 2011-12-10 ENCOUNTER — Encounter (HOSPITAL_COMMUNITY): Payer: Self-pay

## 2011-12-10 ENCOUNTER — Ambulatory Visit (HOSPITAL_COMMUNITY): Payer: BC Managed Care – PPO

## 2011-12-10 ENCOUNTER — Encounter (HOSPITAL_COMMUNITY): Payer: Self-pay | Admitting: *Deleted

## 2011-12-10 DIAGNOSIS — N200 Calculus of kidney: Secondary | ICD-10-CM | POA: Insufficient documentation

## 2011-12-10 DIAGNOSIS — Z01818 Encounter for other preprocedural examination: Secondary | ICD-10-CM | POA: Insufficient documentation

## 2011-12-10 DIAGNOSIS — Z8541 Personal history of malignant neoplasm of cervix uteri: Secondary | ICD-10-CM | POA: Insufficient documentation

## 2011-12-10 DIAGNOSIS — N201 Calculus of ureter: Secondary | ICD-10-CM | POA: Insufficient documentation

## 2011-12-10 HISTORY — DX: Calculus of kidney: N20.0

## 2011-12-10 LAB — PREGNANCY, URINE: Preg Test, Ur: NEGATIVE

## 2011-12-10 SURGERY — LITHOTRIPSY, ESWL
Anesthesia: LOCAL | Laterality: Right

## 2011-12-10 MED ORDER — DIAZEPAM 5 MG PO TABS
ORAL_TABLET | ORAL | Status: AC
  Administered 2011-12-10: 10 mg via ORAL
  Filled 2011-12-10: qty 2

## 2011-12-10 MED ORDER — DIAZEPAM 5 MG PO TABS
10.0000 mg | ORAL_TABLET | ORAL | Status: AC
  Administered 2011-12-10: 10 mg via ORAL

## 2011-12-10 MED ORDER — CIPROFLOXACIN HCL 500 MG PO TABS
ORAL_TABLET | ORAL | Status: AC
  Administered 2011-12-10: 500 mg via ORAL
  Filled 2011-12-10: qty 1

## 2011-12-10 MED ORDER — CIPROFLOXACIN HCL 500 MG PO TABS
500.0000 mg | ORAL_TABLET | ORAL | Status: AC
  Administered 2011-12-10: 500 mg via ORAL

## 2011-12-10 MED ORDER — DEXTROSE-NACL 5-0.45 % IV SOLN
INTRAVENOUS | Status: DC
  Administered 2011-12-10: 15:00:00 via INTRAVENOUS

## 2011-12-10 MED ORDER — DIPHENHYDRAMINE HCL 25 MG PO CAPS
25.0000 mg | ORAL_CAPSULE | ORAL | Status: AC
  Administered 2011-12-10: 25 mg via ORAL

## 2011-12-10 MED ORDER — DIPHENHYDRAMINE HCL 25 MG PO CAPS
ORAL_CAPSULE | ORAL | Status: AC
  Administered 2011-12-10: 25 mg via ORAL
  Filled 2011-12-10: qty 1

## 2011-12-10 NOTE — Op Note (Signed)
Refer to [facility] Op Note scanned in the chart 

## 2011-12-10 NOTE — H&P (Signed)
History and Physical  History of Present Illness:Ms Granade had right JJ stent on 4/5 for an obstructing right proximal ureteral calculus.  She is scheduled today for ESL of the ureteral calculus.  Past Medical History  Diagnosis Date  . Kidney infection     history  . Kidney calculus   . Cervical ca     "disappeared with biopsy treatment"   Past Surgical History  Procedure Date  . Cesarean section   . Arm surgery   . Cystoscopy w/ ureteral stent placement     Medications: I have reviewed the patient's current medications. Allergies:  Allergies  Allergen Reactions  . Morphine And Related Hives    Family History  Problem Relation Age of Onset  . Hypertension Mother   . Fibromyalgia Father   . Urolithiasis Father   . Rheum arthritis Father   . Hypertension Father    Social History:  reports that she quit smoking about 3 years ago. Her smoking use included Cigarettes. She quit after 15 years of use. She does not have any smokeless tobacco history on file. She reports that she drinks alcohol. She reports that she uses illicit drugs (Marijuana).  ROS: All systems are reviewed and negative except as noted.   Physical Exam:  Vital signs in last 24 hours: Temp:  [98.5 F (36.9 C)] 98.5 F (36.9 C) (04/18 1423) Pulse Rate:  [89] 89  (04/18 1423) Resp:  [16] 16  (04/18 1423) BP: (121)/(80) 121/80 mmHg (04/18 1423) SpO2:  [100 %] 100 % (04/18 1423)  Cardiovascular: Skin warm; not flushed Respiratory: Breaths quiet; no shortness of breath Abdomen: No masses Neurological: Normal sensation to touch Musculoskeletal: Normal motor function arms and legs Lymphatics: No inguinal adenopathy Skin: No rashes Genitourinary:Normal female genitalia.  Laboratory Data:  Results for orders placed during the hospital encounter of 12/10/11 (from the past 24 hour(s))  PREGNANCY, URINE     Status: Normal   Collection Time   12/10/11  2:48 PM      Component Value Range   Preg Test, Ur  NEGATIVE  NEGATIVE      Impression/Assessment:  Right ureteral calculus  Plan:  ESL  Garvin Ellena-HENRY 12/10/2011, 4:37 PM

## 2011-12-10 NOTE — Discharge Instructions (Signed)
Lithotripsy Care After Refer to this sheet for the next few weeks. These discharge instructions provide you with general information on caring for yourself after you leave the hospital. Your caregiver may also give you specific instructions. Your treatment has been planned according to the most current medical practices available, but unavoidable complications sometimes occur. If you have any problems or questions after discharge, please call your caregiver. AFTER THE PROCEDURE   The recovery time will vary with the procedure done.   You will be taken to the recovery area. A nurse will watch and check your progress. Once you are awake, stable, and taking fluids well, you will be allowed to go home as long as there are no problems.   Your urine may have a red tinge for a few days after treatment. Blood loss is usually minimal.   You may have soreness in the back or flank area. This usually goes away after a few days. The procedure can cause blotches or bruises on the back where the pressure wave enters the skin. These marks usually cause only minimal discomfort and should disappear in a short time.   Stone fragments should begin to pass within 24 hours of treatment. However, a delayed passage is not unusual.   You may have pain, discomfort, and feel sick to your stomach (nauseous) when the crushed (pulverized) fragments of stone are passed down the tube from the kidney to the bladder. Stone fragments can pass soon after the procedure and may last for up to 4 to 8 weeks.   A small number of patients may have severe pain when stone fragments are not able to pass, which leads to an obstruction.   If your stone is greater than 1 inch/2.5 centimeters in diameter or if you have multiple stones that have a combined diameter greater than 1 inch/2.5 centimeters, you may require more than 1 treatment.   You must have someone drive you home.  HOME CARE INSTRUCTIONS   Rest at home until you feel your  energy improving.   Only take over-the-counter or prescription medicines for pain, discomfort, or fever as directed by your caregiver. Depending on the type of lithotripsy, you may need to take medicines (antibiotics) that kill germs and anti-inflammatory medicines for a few days.   Drink enough water and fluids to keep your urine clear or pale yellow. This helps "flush" your kidneys. It helps pass any remaining pieces of stone and prevents stones from coming back.   Most people can resume daily activities within 1 or 2 days after standard lithotripsy. It can take longer to recover from laser and percutaneous lithotripsy.   If the stones are in your urinary system, you may be asked to strain your urine at home to look for stones. Any stones that are found can be sent to a medical lab for examination.   Visit your caregiver for a follow-up appointment in a few weeks. Your doctor may remove your stent if you have one. Your caregiver will also check to see whether stone particles still remain.  SEEK MEDICAL CARE IF:   You have an oral temperature above 102 F (38.9 C).   Your pain is not relieved by medicine.   You have a lasting nauseous feeling.   You feel there is too much blood in the urine.   You develop persistent problems with frequent and/or painful urination that does not at least partially improve after 2 days following the procedure.   You have a congested   cough.   You feel lightheaded.   You develop a rash or any other signs that might suggest an allergic problem.   You develop any reaction or side effects to your medicine(s).  SEEK IMMEDIATE MEDICAL CARE IF:   You experience severe back and/or flank pain.   You see nothing but blood when you urinate.   You cannot pass any urine at all.   You have an oral temperature above 102 F (38.9 C), not controlled by medicine.   You develop shortness of breath, difficulty breathing, or chest pain.   You develop vomiting  that will not stop after 6 to 8 hours.   You have a fainting episode.  MAKE SURE YOU:   Understand these instructions.   Will watch your condition.   Will get help right away if you are not doing well or get worse.  Document Released: 08/30/2007 Document Revised: 07/30/2011 Document Reviewed: 08/30/2007 [employer] Patient Information 2012 [employer]. 

## 2011-12-11 ENCOUNTER — Encounter (HOSPITAL_COMMUNITY): Payer: Self-pay | Admitting: Urology

## 2012-07-28 IMAGING — CT CT ABD-PELV W/O CM
2 of 4 series · 16 of 46 positions shown, 18 images · non-contrast
Comparison: None.

CLINICAL DATA: Right-sided flank pain, no history of renal stones

CT ABDOMEN AND PELVIS WITHOUT CONTRAST
TECHNIQUE: Multidetector CT imaging of the abdomen and pelvis was
performed following the standard protocol without intravenous
contrast.

[Series 2: stone 160 5.0 b31f st · axial · 0.64mm/px · z∈[-556,-96]mm · 13 of 101 slices shown, 15 images]
[im 5/101  soft-tissue]
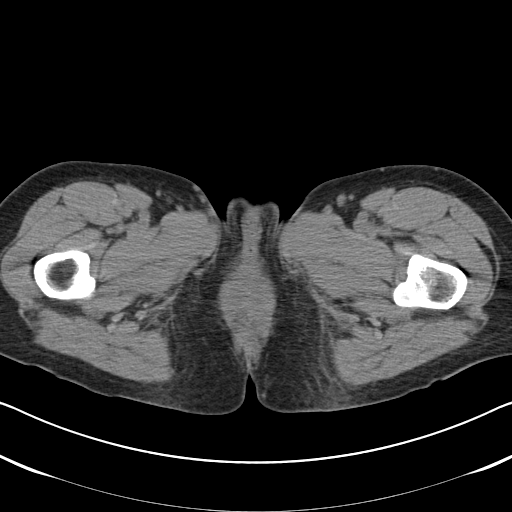
[im 5/101  bone]
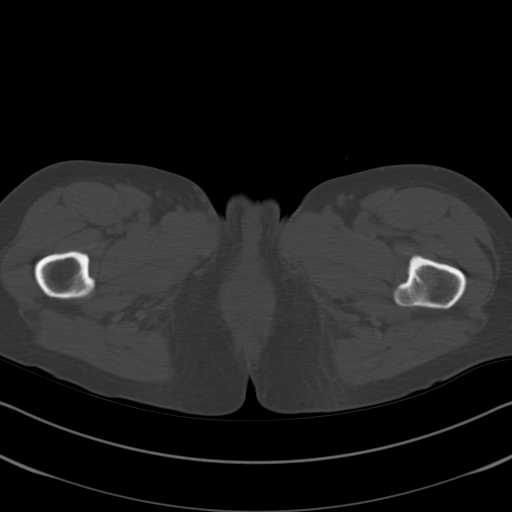
[im 13/101  soft-tissue]
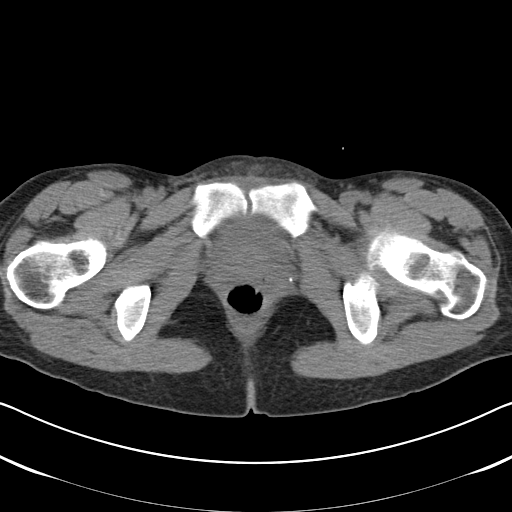
[im 21/101  soft-tissue]
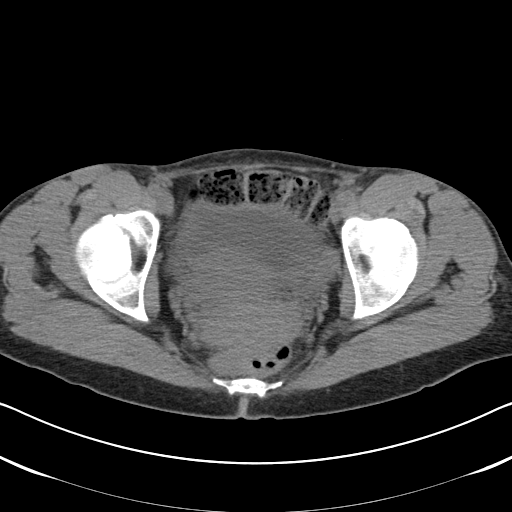
[im 29/101  soft-tissue]
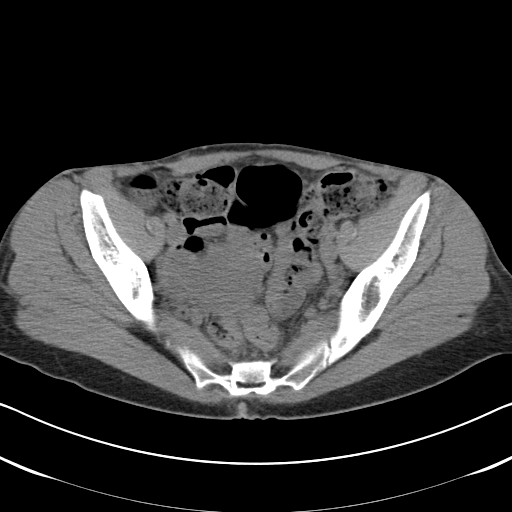
[im 37/101  soft-tissue]
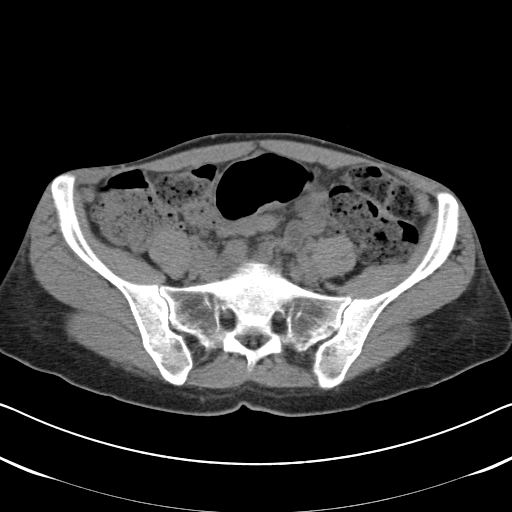
[im 45/101  soft-tissue]
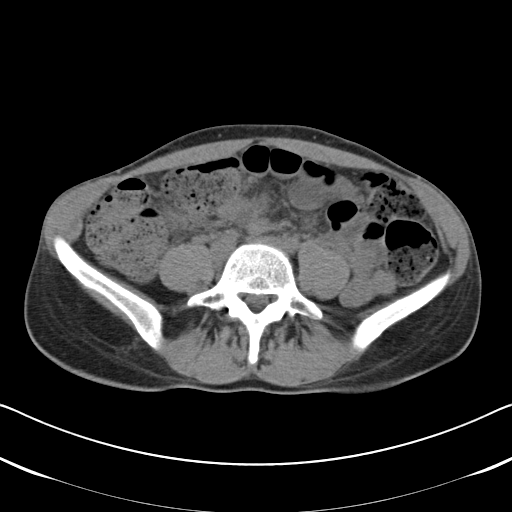
[im 53/101  soft-tissue]
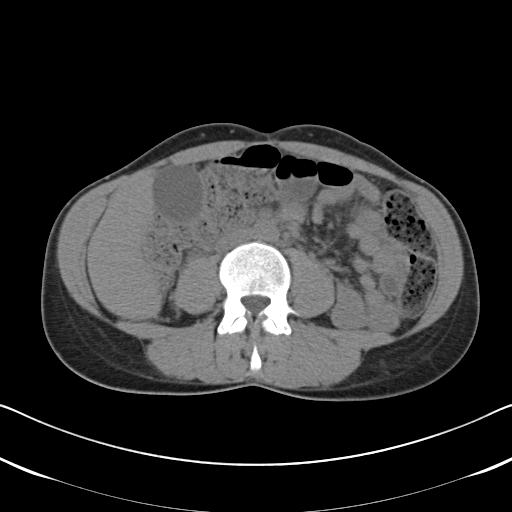
[im 57/101  soft-tissue]
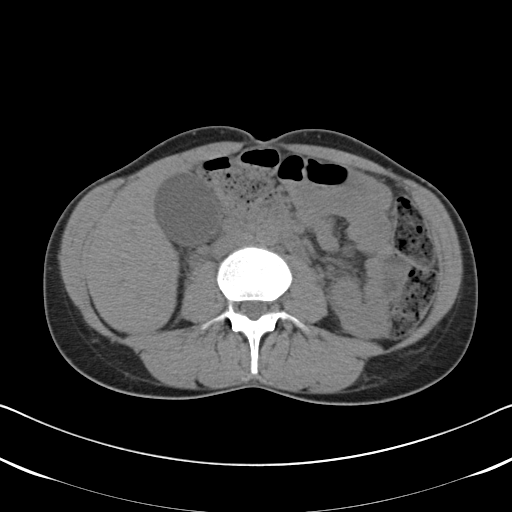
[im 65/101  soft-tissue]
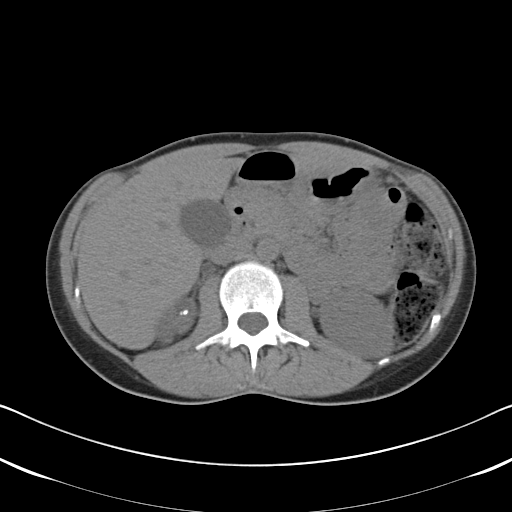
[im 65/101  bone]
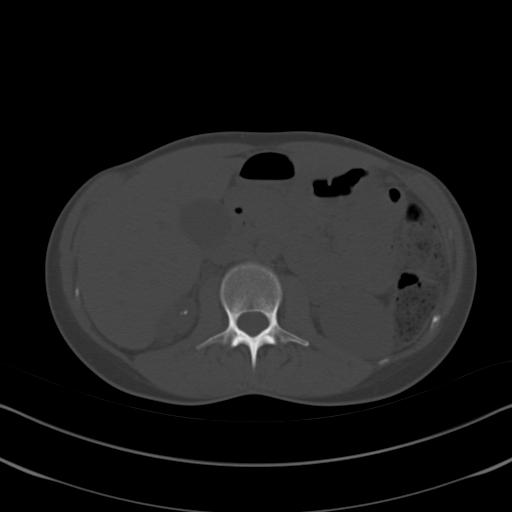
[im 73/101  soft-tissue]
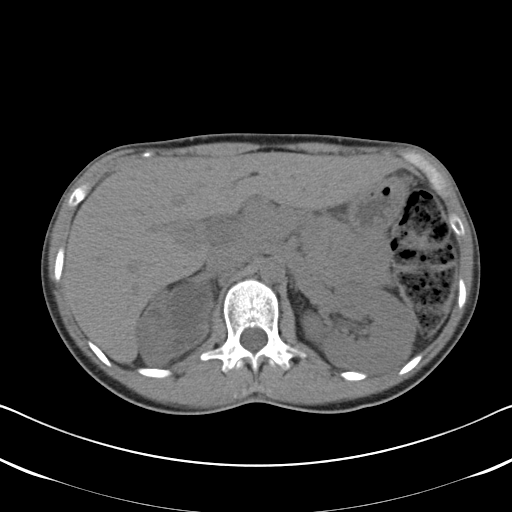
[im 81/101  soft-tissue]
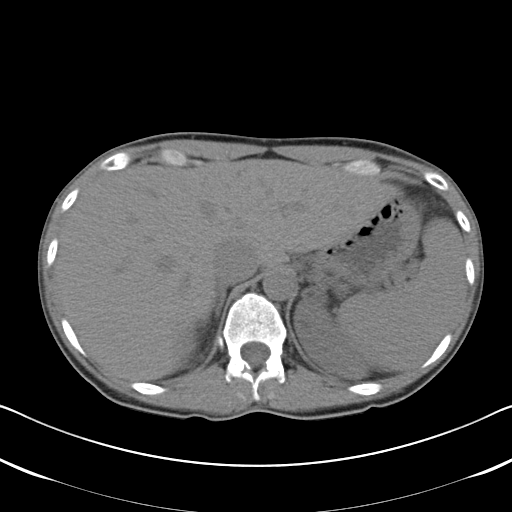
[im 89/101  soft-tissue]
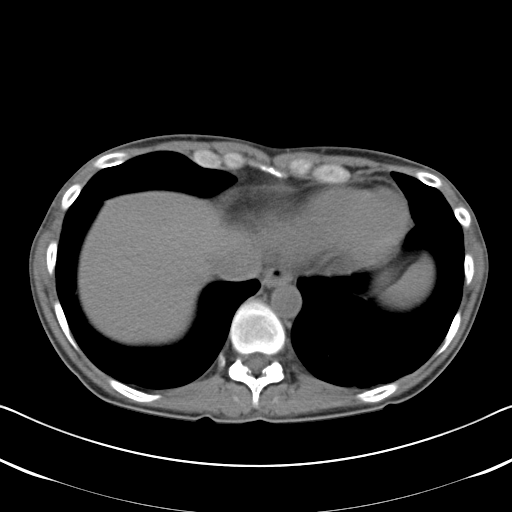
[im 97/101  soft-tissue]
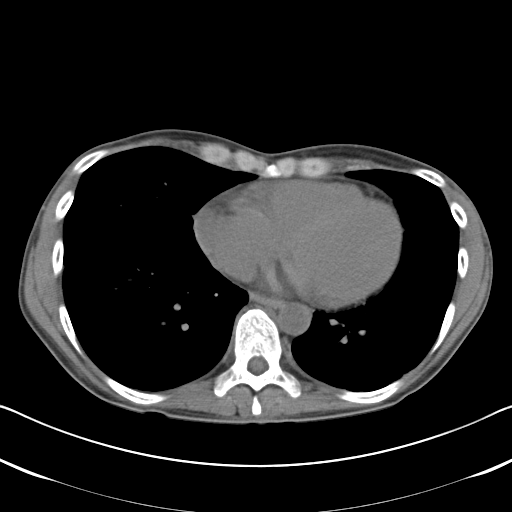

[Series 602: <mpr thick range> · coronal · 0.98mm/px · 3 of 61 slices shown]
[im 21/61  soft-tissue]
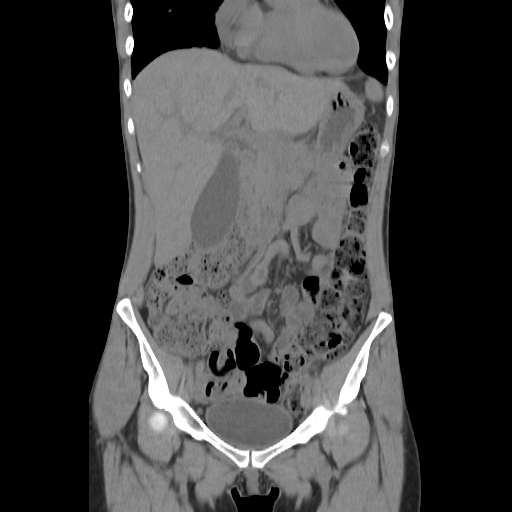
[im 27/61  soft-tissue]
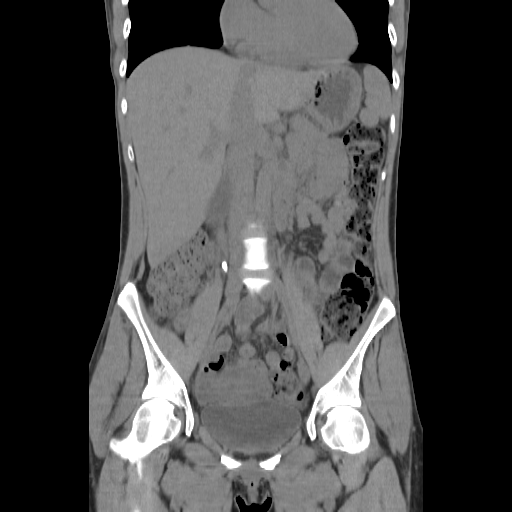
[im 34/61  soft-tissue]
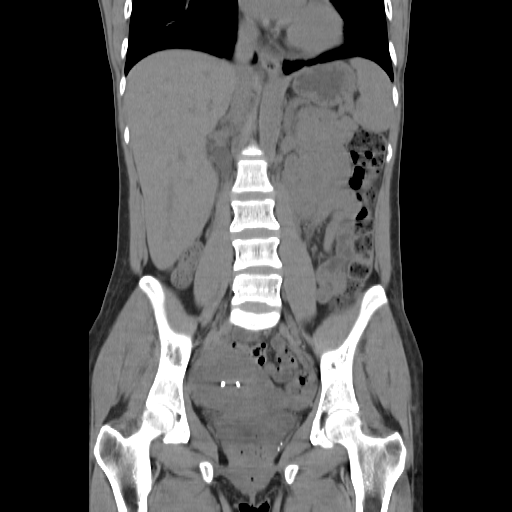

[16 of 46 positions shown; findings below may reference images not displayed]

FINDINGS: The lack of intravenous contrast limits the ability to evaluate
solid abdominal organs.

Normal hepatic contour. The gallbladder is somewhat distended but
otherwise normal on this noncontrast examination.  No definite
evidence of gallbladder wall thickening or pericholecystic fluid.
No ascites.

The right kidney is noted to be atrophied in comparison to the left
with the right kidney measuring approximately 8.3 cm in length
(coronal image 43, series [DATE]) and the left kidney measuring
approximately 12.7 cm in length (image 14, series 602).  There is
moderate right-sided ureterectasis and hydronephrosis secondary to
an obstructing approximately 5 x 6 x 10 mm stone within the mid
aspect of the right ureter.  There are two punctate (approximately
2-3 mm) nonobstructing stones within the lower pole of the right
kidney (image 37, series 2).  No left-sided renal stones. Several
hypoattenuating right-sided renal lesions are suspected but these
may represent dilated calyces but are incompletely evaluated
without intravenous contrast.  No perinephric stranding.  No stones
are seen within the urinary bladder.  No left-sided hydronephrosis.
Normal noncontrast appearance of the bilateral adrenal glands,
pancreas and spleen.

Evaluation of the bowel is limited secondary to lack of enteric
contrast and mesenteric fat.  Suspect colonic diverticulosis.  No
definite evidence of diverticulitis.  The appendix is not
definitively identified, however there is no stranding within the
right lower abdominal quadrant.  Normal caliber abdominal aorta.
No definite retroperitoneal, mesenteric, pelvic or inguinal
lymphadenopathy, though note, again evaluation limited secondary to
lack of intravenous contrast.  An IUD is seen within the uterus.
No discrete adnexal mass.  No free fluid within the pelvis.

Limited visualization of the lower thorax demonstrates minimal
dependent atelectasis.  No focal airspace opacities.  No pleural
effusion.  Normal heart size.  No pericardial effusion.  Incidental
note is made of a mild pectus exclude deformity.

No acute or aggressive osseous abnormalities.
IMPRESSION: 1.  Obstructing approximately  5 x 6 x 10 mm stone within the mid
aspect of the right ureter with associated moderate upstream
ureterectasis and hydronephrosis. Given the associated atrophy of
the right kidney, the chronicity of this finding is indeterminate.
No perinephric stranding.

2.  Additional punctate nonobstructing right-sided renal stones.

3. Suspect several right-sided hypoattenuating renal lesions,
versus dilated calyces, incompletely evaluated without intravenous
contrast.

## 2012-12-15 ENCOUNTER — Other Ambulatory Visit: Payer: Self-pay

## 2012-12-15 DIAGNOSIS — Z1231 Encounter for screening mammogram for malignant neoplasm of breast: Secondary | ICD-10-CM

## 2012-12-28 ENCOUNTER — Ambulatory Visit
Admission: RE | Admit: 2012-12-28 | Discharge: 2012-12-28 | Disposition: A | Discharge status: Home / Self Care | Payer: 59 | Source: Ambulatory Visit

## 2012-12-28 DIAGNOSIS — Z1231 Encounter for screening mammogram for malignant neoplasm of breast: Secondary | ICD-10-CM

## 2013-06-02 ENCOUNTER — Other Ambulatory Visit: Payer: Self-pay | Admitting: Family Medicine

## 2013-06-02 ENCOUNTER — Other Ambulatory Visit (HOSPITAL_COMMUNITY)
Admission: RE | Admit: 2013-06-02 | Discharge: 2013-06-02 | Disposition: A | Discharge status: Home / Self Care | Payer: 59 | Source: Ambulatory Visit | Attending: Family Medicine | Admitting: Family Medicine

## 2013-06-02 DIAGNOSIS — Z01419 Encounter for gynecological examination (general) (routine) without abnormal findings: Secondary | ICD-10-CM | POA: Insufficient documentation

## 2015-01-30 ENCOUNTER — Ambulatory Visit (INDEPENDENT_AMBULATORY_CARE_PROVIDER_SITE_OTHER): Payer: Managed Care, Other (non HMO) | Admitting: Physician Assistant

## 2015-01-30 VITALS — BP 124/80 | HR 91 | Temp 98.4°F | Resp 17 | Ht 65.0 in | Wt 128.0 lb

## 2015-01-30 DIAGNOSIS — M5441 Lumbago with sciatica, right side: Secondary | ICD-10-CM | POA: Diagnosis not present

## 2015-01-30 MED ORDER — CYCLOBENZAPRINE HCL 10 MG PO TABS: 10.0000 mg | ORAL_TABLET | Freq: Three times a day (TID) | ORAL | Status: AC | PRN

## 2015-01-30 MED ORDER — PREDNISONE 20 MG PO TABS: ORAL_TABLET | ORAL | Status: AC

## 2015-01-30 NOTE — Patient Instructions (Signed)
Take prednisone until finished. Do not take any medications with ibuprofen, naprosyn or aspirin with the prednisone. You should also take tylenol 1000 mg three times a day. You can take flexeril thee times a day if you can tolerate. If not just take at night - may double dose to 10 mg. Heat, gentle massage and gentle stretching can help. Remain active but don't do anything too strenuous. If you are not getting better in 10-14 days, return to clinic.

## 2015-01-30 NOTE — Progress Notes (Signed)
[facility] [address] [place] [address] [phone]  Date:  01/30/2015   Name:  Candice Clark   DOB:  [birth year]   MRN:  [id number]  PCP:  Linward Foster, MD    Chief Complaint: Back Pain   History of Present Illness:  This is a 44 y.o. female who is presenting with sharp pain in right lower back x 1 week ago. Pain is shooting down side of right leg to level of knee. Thinks she injured her back at the gym. Patient is very active at the gym-she does lots of squats and leg exercises. She has never injured her back before. Pain is worse with sitting and laying down and is better with walking. Denies paresthesias, weakness, problems with bowel or bladder. She has been taking ibuprofen 1500 mg every 6 hours and not helping at all. She has also tried heat alternating with ice.  Review of Systems:  Review of Systems  Constitutional: Negative for fever and chills.  Gastrointestinal: Negative for nausea, vomiting and abdominal pain.  Genitourinary: Negative for dysuria.  Musculoskeletal: Positive for back pain. Negative for neck pain.  Skin: Negative for color change and rash.  Neurological: Negative for weakness and numbness.  Psychiatric/Behavioral: Positive for sleep disturbance.    There are no active problems to display for this patient.   Prior to Admission medications   Medication Sig Start Date End Date Taking? Authorizing Provider  levonorgestrel (MIRENA) 20 MCG/24HR IUD 1 each by Intrauterine route once.   Yes Historical Provider, MD  HYDROcodone-acetaminophen (LORTAB) 10-500 MG per tablet Take 1 tablet by mouth every 6 (six) hours as needed. For pain    Historical Provider, MD  oxybutynin (DITROPAN) 5 MG tablet Take 5 mg by mouth 3 (three) times daily.    Historical Provider, MD  solifenacin (VESICARE) 10 MG tablet Take 10 mg by mouth daily.    Historical Provider, MD    Allergies  Allergen Reactions  . Morphine And Related Hives     Past Surgical History  Procedure Laterality Date  . Cesarean section    . Arm surgery    . Cystoscopy w/ ureteral stent placement    . Cystoscopy w/ ureteral stent placement  11/27/2011    Procedure: CYSTOSCOPY WITH RETROGRADE PYELOGRAM/URETERAL STENT PLACEMENT;  Surgeon: Hanley Ben, MD;  Location: [facility];  Service: Urology;  Laterality: Right;    History  Substance Use Topics  . Smoking status: Former Smoker -- 15 years    Types: Cigarettes    Quit date: 11/26/2008  . Smokeless tobacco: Not on file  . Alcohol Use: 0.0 oz/week    3-4 Cans of beer per week    Family History  Problem Relation Age of Onset  . Hypertension Mother   . Fibromyalgia Father   . Urolithiasis Father   . Rheum arthritis Father   . Hypertension Father     Medication list has been reviewed and updated.  Physical Examination:  Physical Exam  Constitutional: She is oriented to person, place, and time. She appears well-developed and well-nourished. No distress.  HENT:  Head: Normocephalic and atraumatic.  Right Ear: Hearing normal.  Left Ear: Hearing normal.  Nose: Nose normal.  Eyes: Conjunctivae and lids are normal. Right eye exhibits no discharge. Left eye exhibits no discharge. No scleral icterus.  Cardiovascular: Normal rate, regular rhythm, normal heart sounds and normal pulses.   No murmur heard. Pulmonary/Chest: Effort normal and breath sounds normal. No respiratory distress.  She has no wheezes. She has no rhonchi. She has no rales.  Abdominal: Soft. Normal appearance. There is no tenderness. There is no CVA tenderness.  Musculoskeletal: Normal range of motion.       Lumbar back: She exhibits tenderness (right paraspinal). She exhibits normal range of motion and no bony tenderness.       Back:  Straight leg raise negative bilaterally  Neurological: She is alert and oriented to person, place, and time. She has normal strength and normal reflexes. No sensory deficit. Gait normal.   Skin: Skin is warm, dry and intact. No lesion and no rash noted.  Psychiatric: She has a normal mood and affect. Her speech is normal and behavior is normal. Thought content normal.   BP 124/80 mmHg  Pulse 91  Temp(Src) 98.4 F (36.9 C) (Oral)  Resp 17  Ht 5\' 5"  (1.651 m)  Wt 128 lb (58.06 kg)  BMI 21.30 kg/m2  SpO2 97%  LMP 12/30/2014  Assessment and Plan:  1. Right-sided low back pain with right-sided sciatica Patient likely has low back strain with right sciatica. No need for radiographs as no bony tenderness and no red flag symptoms. Prednisone for sciatica. Flexeril for muscle strain. Counseled on heat, gentle massage, gentle stretching. If not getting better in 2 weeks she will return to clinic. - predniSONE (DELTASONE) 20 MG tablet; Take 3 PO QAM x3days, 2 PO QAM x3days, 1 PO QAM x3days  Dispense: 18 tablet; Refill: 0 - cyclobenzaprine (FLEXERIL) 10 MG tablet; Take 1 tablet (10 mg total) by mouth 3 (three) times daily as needed for muscle spasms.  Dispense: 30 tablet; Refill: 0   Benjaman Pott. Drenda Freeze, MHS [facility]  02/03/2015

## 2016-06-05 ENCOUNTER — Other Ambulatory Visit: Payer: Self-pay | Admitting: Obstetrics and Gynecology

## 2016-06-05 DIAGNOSIS — R928 Other abnormal and inconclusive findings on diagnostic imaging of breast: Secondary | ICD-10-CM

## 2016-06-15 ENCOUNTER — Ambulatory Visit
Admission: RE | Admit: 2016-06-15 | Discharge: 2016-06-15 | Disposition: A | Discharge status: Home / Self Care | Payer: Self-pay | Source: Ambulatory Visit | Attending: Obstetrics and Gynecology | Admitting: Obstetrics and Gynecology

## 2016-06-15 ENCOUNTER — Ambulatory Visit
Admission: RE | Admit: 2016-06-15 | Discharge: 2016-06-15 | Disposition: A | Discharge status: Home / Self Care | Payer: Managed Care, Other (non HMO) | Source: Ambulatory Visit | Attending: Obstetrics and Gynecology | Admitting: Obstetrics and Gynecology

## 2016-06-15 ENCOUNTER — Other Ambulatory Visit: Payer: Self-pay | Admitting: Obstetrics and Gynecology

## 2016-06-15 DIAGNOSIS — N6320 Unspecified lump in the left breast, unspecified quadrant: Secondary | ICD-10-CM

## 2016-06-15 DIAGNOSIS — R928 Other abnormal and inconclusive findings on diagnostic imaging of breast: Secondary | ICD-10-CM

## 2016-06-17 ENCOUNTER — Other Ambulatory Visit: Payer: Self-pay | Admitting: Obstetrics and Gynecology

## 2016-06-17 ENCOUNTER — Ambulatory Visit
Admission: RE | Admit: 2016-06-17 | Discharge: 2016-06-17 | Disposition: A | Discharge status: Home / Self Care | Payer: Managed Care, Other (non HMO) | Source: Ambulatory Visit | Attending: Obstetrics and Gynecology | Admitting: Obstetrics and Gynecology

## 2016-06-17 DIAGNOSIS — N6320 Unspecified lump in the left breast, unspecified quadrant: Secondary | ICD-10-CM

## 2021-11-17 ENCOUNTER — Other Ambulatory Visit: Payer: Self-pay | Admitting: Obstetrics and Gynecology

## 2021-11-17 DIAGNOSIS — N6489 Other specified disorders of breast: Secondary | ICD-10-CM

## 2021-12-03 ENCOUNTER — Ambulatory Visit
Admission: RE | Admit: 2021-12-03 | Discharge: 2021-12-03 | Disposition: A | Discharge status: Home / Self Care | Payer: PRIVATE HEALTH INSURANCE | Source: Ambulatory Visit | Attending: Obstetrics and Gynecology | Admitting: Obstetrics and Gynecology

## 2021-12-03 ENCOUNTER — Ambulatory Visit
Admission: RE | Admit: 2021-12-03 | Discharge: 2021-12-03 | Disposition: A | Discharge status: Home / Self Care | Payer: Managed Care, Other (non HMO) | Source: Ambulatory Visit | Attending: Obstetrics and Gynecology | Admitting: Obstetrics and Gynecology

## 2021-12-03 DIAGNOSIS — N6489 Other specified disorders of breast: Secondary | ICD-10-CM

## 2023-01-26 ENCOUNTER — Ambulatory Visit
Admission: RE | Admit: 2023-01-26 | Discharge: 2023-01-26 | Disposition: A | Discharge status: Home / Self Care | Payer: BC Managed Care – PPO | Source: Ambulatory Visit | Attending: Family Medicine | Admitting: Family Medicine

## 2023-01-26 ENCOUNTER — Other Ambulatory Visit: Payer: Self-pay | Admitting: Family Medicine

## 2023-01-26 DIAGNOSIS — M5412 Radiculopathy, cervical region: Secondary | ICD-10-CM

## 2023-08-03 ENCOUNTER — Encounter: Payer: Self-pay | Admitting: Obstetrics and Gynecology

## 2023-08-04 ENCOUNTER — Other Ambulatory Visit: Payer: Self-pay | Admitting: Obstetrics and Gynecology

## 2023-08-04 DIAGNOSIS — R928 Other abnormal and inconclusive findings on diagnostic imaging of breast: Secondary | ICD-10-CM

## 2023-08-07 ENCOUNTER — Inpatient Hospital Stay
Admission: RE | Admit: 2023-08-07 | Discharge: 2023-08-07 | Discharge status: Home / Self Care | Payer: BC Managed Care – PPO | Source: Ambulatory Visit | Attending: Obstetrics and Gynecology | Admitting: Obstetrics and Gynecology

## 2023-08-07 DIAGNOSIS — R928 Other abnormal and inconclusive findings on diagnostic imaging of breast: Secondary | ICD-10-CM

## 2023-12-31 ENCOUNTER — Encounter: Payer: Self-pay | Admitting: Obstetrics and Gynecology

## 2023-12-31 ENCOUNTER — Other Ambulatory Visit: Payer: Self-pay | Admitting: Obstetrics and Gynecology

## 2023-12-31 DIAGNOSIS — R921 Mammographic calcification found on diagnostic imaging of breast: Secondary | ICD-10-CM

## 2024-02-07 ENCOUNTER — Ambulatory Visit
Admission: RE | Admit: 2024-02-07 | Discharge: 2024-02-07 | Disposition: A | Discharge status: Home / Self Care | Source: Ambulatory Visit | Attending: Obstetrics and Gynecology | Admitting: Obstetrics and Gynecology

## 2024-02-07 DIAGNOSIS — R921 Mammographic calcification found on diagnostic imaging of breast: Secondary | ICD-10-CM
